# Patient Record
Sex: Female | Born: 1985 | Race: Black or African American | Hispanic: No | Marital: Single | Smoking: Never smoker
Health system: Southern US, Community
[De-identification: ages and names within clinical notes are randomized; demographics above are authoritative.]

## PROBLEM LIST (undated history)

## (undated) DIAGNOSIS — J45909 Unspecified asthma, uncomplicated: Secondary | ICD-10-CM

---

## 2015-06-07 ENCOUNTER — Ambulatory Visit
Admission: EM | Admit: 2015-06-07 | Discharge: 2015-06-07 | Disposition: A | Discharge status: Home / Self Care | Payer: Self-pay | Attending: Family Medicine | Admitting: Family Medicine

## 2015-06-07 ENCOUNTER — Encounter: Payer: Self-pay | Admitting: Emergency Medicine

## 2015-06-07 DIAGNOSIS — M26629 Arthralgia of temporomandibular joint, unspecified side: Secondary | ICD-10-CM

## 2015-06-07 HISTORY — DX: Unspecified asthma, uncomplicated: J45.909

## 2015-06-07 MED ORDER — LORATADINE 10 MG PO TABS: 10.0000 mg | ORAL_TABLET | Freq: Every day | ORAL | Status: AC

## 2015-06-07 MED ORDER — MELOXICAM 15 MG PO TABS: 15.0000 mg | ORAL_TABLET | Freq: Every day | ORAL | Status: DC | PRN

## 2015-06-07 NOTE — ED Notes (Signed)
Pt reports about 3 days of R ear pain going down neck. Nasal congestion, denies fever or chills.

## 2015-06-07 NOTE — Discharge Instructions (Signed)
Take medication as prescribed. Avoid chewy foods or repetitive jaw motions as discussed. Apply ice.   Follow up with your primary care physician as needed. Return to Urgent care as needed for new or worsening concerns.

## 2015-06-07 NOTE — ED Provider Notes (Signed)
[facility]  ____________________________________________  Time seen: Approximately 7:15 PM  I have reviewed the triage vital signs and the nursing notes.   HISTORY  Chief Complaint Otalgia   HPI [name] is a 29 y.o. female presents for complaints of right ear pain. Patient reports that she has had right ear pain intermittently 2-3 days. Patient states 3-4 days she has had some runny nose, nasal congestion and occasional cough. States that she has taken over-the-counter cough and cold treatments and states that her runny nose and congestion has improved. Patient states pain in right ear is a mild aching sensation. Denies pain radiation. Denies neck pain. Denies headache, fevers, fall or injury. Patient reports that she has been frequently moving her jaw trying to "pop her ear " because over the last several days when she had congestion she felt she had fluid in her ear. Patient states that she's also been chewing gum recently.  And patient also reports that when she is stressed she clenches down her teeth which she has noticed that she does that occasionally. Denies known grinding teeth at night. Denies hearing changes. Denies trauma.   Denies chest pain, shortness of breath, abdominal pain, fevers, nausea, vomiting, dizziness or other complaints.      Past Medical History  Diagnosis Date  . Asthma     There are no active problems to display for this patient.   History reviewed. No pertinent past surgical history.  Current Outpatient Rx  Name  Route  Sig  Dispense  Refill  .           Marland Kitchen             Allergies Review of patient's allergies indicates no known allergies.  History reviewed. No pertinent family history.  Social History Social History  Substance Use Topics  . Smoking status: Never Smoker   . Smokeless tobacco: None  . Alcohol Use: Yes   last menstrual : This past week. Patient states that she is not sexually active. Denies chance of  pregnancy. Patient states that she does not need to be tested for pregnancy ;refused pregnancy testing.  Review of Systems Constitutional: No fever/chills Eyes: No visual changes. ENT: No sore throat. Right ear discomfort. Recent runny nose and congestion. Cardiovascular: Denies chest pain. Respiratory: Denies shortness of breath. Gastrointestinal: No abdominal pain.  No nausea, no vomiting.  No diarrhea.  No constipation. Genitourinary: Negative for dysuria. Musculoskeletal: Negative for back pain. Skin: Negative for rash. Neurological: Negative for headaches, focal weakness or numbness.  10-point ROS otherwise negative.  ____________________________________________   PHYSICAL EXAM:  VITAL SIGNS: ED Triage Vitals  Enc Vitals Group     BP 06/07/15 1844 123/57 mmHg     Pulse Rate 06/07/15 1844 70     Resp 06/07/15 1844 16     Temp 06/07/15 1844 98.1 F (36.7 C)     Temp Source 06/07/15 1844 Oral     SpO2 06/07/15 1844 100 %     Weight 06/07/15 1844 327 lb (148.326 kg)     Height 06/07/15 1844  (1.702 m)     Head Cir --      Peak Flow --      Pain Score 06/07/15 1848 3     Pain Loc --      Pain Edu? --      Excl. in GC? --     Constitutional: Alert and oriented. Well appearing and in no acute distress. Eyes: Conjunctivae are  normal. PERRL. EOMI. Head: Atraumatic. No sinus tenderness to palpation. No swelling or erythema. Left TMJ nontender. Moderate right TMJ tenderness to palpation, no swelling, full range of motion present to mouth and jaw.. No trismus. Speech clear.  Ears: no erythema, normal TMs bilaterally. No swelling, exudate or drainage. Nontender to palpation. No surrounding erythema or swelling.   Nose: Minimal clear rhinorrhea.  Mouth/Throat: Mucous membranes are moist.  Oropharynx non-erythematous. No tonsillar swelling or exudate. No uvular shift or deviation. Neck: No stridor.  No cervical spine tenderness to  palpation. Hematological/Lymphatic/Immunilogical: No cervical or auricular lymphadenopathy. Cardiovascular: Normal rate, regular rhythm. Grossly normal heart sounds.  Good peripheral circulation. Respiratory: Normal respiratory effort.  No retractions. Lungs CTAB. No wheezes, rales or rhonchi. Good air movement. Gastrointestinal: Soft and nontender. Obese abdomen. Normal Bowel sounds.   Musculoskeletal: No lower or upper extremity tenderness nor edema. No cervical, thoracic or lumbar tenderness to palpation.  Neurologic:  Normal speech and language. No gross focal neurologic deficits are appreciated. No gait instability. Skin:  Skin is warm, dry and intact. No rash noted. Psychiatric: Mood and affect are normal. Speech and behavior are normal.  ____________________________________________   LABS (all labs ordered are listed, but only abnormal results are displayed)  Labs Reviewed - No data to display  INITIAL IMPRESSION / ASSESSMENT AND PLAN / ED COURSE  Pertinent labs & imaging results that were available during my care of the patient were reviewed by me and considered in my medical decision making (see chart for details).   Very well-appearing patient. No acute distress. Presents for the complaint of to 3 days of right ear discomfort described as a mild aching sensation. Patient reports prior to onset of right ear pain she has had a few days of runny nose, nasal congestion and occasional cough. Patient states that cough and congestion has almost fully resolved. Patient does reports that while congestion was present she frequently tried to pop her ear by moving jaw in a motion of opening and closing. Denies trauma. Patient with right TMJ moderate tenderness to palpation. No signs of acute infection. Will treat supportively with daily Mobic. Patient also requests prescription for Claritin as she no longer has any, prescription given. Encouraged patient to apply ice as well as avoid eating  excessively chewy foods as well as to avoid clenching teeth. Patient to follow-up with her primary care physician.  Discussed follow up with Primary care physician this week. Discussed follow up and return parameters including no resolution or any worsening concerns. Patient verbalized understanding and agreed to plan.   ____________________________________________   FINAL CLINICAL IMPRESSION(S) / ED DIAGNOSES  Final diagnoses:  TMJ pain dysfunction syndrome       Renford DillsLindsey Ericson Nafziger, NP 06/07/15 2125

## 2017-08-04 ENCOUNTER — Encounter: Payer: Self-pay | Admitting: Emergency Medicine

## 2017-08-04 ENCOUNTER — Ambulatory Visit
Admission: EM | Admit: 2017-08-04 | Discharge: 2017-08-04 | Disposition: A | Discharge status: Home / Self Care | Payer: BLUE CROSS/BLUE SHIELD | Attending: Family Medicine | Admitting: Family Medicine

## 2017-08-04 ENCOUNTER — Other Ambulatory Visit: Payer: Self-pay

## 2017-08-04 DIAGNOSIS — R109 Unspecified abdominal pain: Secondary | ICD-10-CM

## 2017-08-04 DIAGNOSIS — R300 Dysuria: Secondary | ICD-10-CM | POA: Diagnosis not present

## 2017-08-04 DIAGNOSIS — R350 Frequency of micturition: Secondary | ICD-10-CM | POA: Diagnosis not present

## 2017-08-04 DIAGNOSIS — R110 Nausea: Secondary | ICD-10-CM | POA: Diagnosis not present

## 2017-08-04 LAB — URINALYSIS, COMPLETE (UACMP) WITH MICROSCOPIC
BILIRUBIN URINE: NEGATIVE
Glucose, UA: NEGATIVE mg/dL
HGB URINE DIPSTICK: NEGATIVE
Ketones, ur: NEGATIVE mg/dL
LEUKOCYTES UA: NEGATIVE
NITRITE: NEGATIVE
PROTEIN: NEGATIVE mg/dL
SPECIFIC GRAVITY, URINE: 1.025 (ref 1.005–1.030)
pH: 6.5 (ref 5.0–8.0)

## 2017-08-04 LAB — PREGNANCY, URINE: PREG TEST UR: NEGATIVE

## 2017-08-04 MED ORDER — SULFAMETHOXAZOLE-TRIMETHOPRIM 800-160 MG PO TABS: 1.0000 | ORAL_TABLET | Freq: Two times a day (BID) | ORAL | 0 refills | Status: DC

## 2017-08-04 NOTE — ED Provider Notes (Signed)
[facility]    CSN: [id number] Arrival date & time: 08/04/17  1915     History   Chief Complaint Chief Complaint  Patient presents with  . Abdominal Pain    HPI Linda Gross is a 32 y.o. female.   The history is provided by the patient.  Abdominal Pain  Associated symptoms: dysuria and nausea   Associated symptoms: no fever, no vaginal discharge and no vomiting   Urinary Frequency  This is a new problem. Associated symptoms include abdominal pain.  Dysuria  Pain quality:  Aching Pain severity:  Mild Onset quality:  Sudden Duration:  1 day Timing:  Constant Progression:  Unchanged Chronicity:  New Recent urinary tract infections: no   Relieved by:  None tried Ineffective treatments:  None tried Urinary symptoms: frequent urination   Urinary symptoms comment:  Urgency Associated symptoms: abdominal pain and nausea   Associated symptoms: no fever, no flank pain, no genital lesions, no vaginal discharge and no vomiting   Risk factors: no hx of pyelonephritis, no hx of urolithiasis, no kidney transplant, not pregnant, no recurrent urinary tract infections, no renal cysts, no renal disease, no single kidney and no urinary catheter     Past Medical History:  Diagnosis Date  . Asthma     There are no active problems to display for this patient.   History reviewed. No pertinent surgical history.  OB History    No data available       Home Medications    Prior to Admission medications   Medication Sig Start Date End Date Taking? Authorizing Provider  albuterol (PROVENTIL HFA;VENTOLIN HFA) 108 (90 Base) MCG/ACT inhaler Inhale 2 puffs into the lungs every 6 (six) hours as needed for wheezing or shortness of breath.   Yes [provider]  fluticasone (FLOVENT HFA) 110 MCG/ACT inhaler Inhale 2 puffs into the lungs 2 (two) times daily.   Yes [provider]  loratadine (CLARITIN) 10 MG tablet Take 1 tablet (10 mg total) by mouth daily.  06/07/15   Renford DillsMiller, Lindsey, NP  meloxicam (MOBIC) 15 MG tablet Take 1 tablet (15 mg total) by mouth daily as needed for pain. 06/07/15   Renford DillsMiller, Lindsey, NP  sulfamethoxazole-trimethoprim (BACTRIM DS,SEPTRA DS) 800-160 MG tablet Take 1 tablet by mouth 2 (two) times daily. 08/04/17   Payton Mccallumonty, Kobee Medlen, MD    Family History Family History  Problem Relation Age of Onset  . Diabetes Mother   . Hypertension Mother   . Atrial fibrillation Mother   . Congestive Heart Failure Father   . Heart disease Father   . Diabetes Father   . Hypertension Father   . Alcohol abuse Father     Social History Social History   Tobacco Use  . Smoking status: Never Smoker  . Smokeless tobacco: Never Used  Substance Use Topics  . Alcohol use: Yes    Comment: socially  . Drug use: No     Allergies   Patient has no known allergies.   Review of Systems Review of Systems  Constitutional: Negative for fever.  Gastrointestinal: Positive for abdominal pain and nausea. Negative for vomiting.  Genitourinary: Positive for dysuria and frequency. Negative for flank pain and vaginal discharge.     Physical Exam Triage Vital Signs ED Triage Vitals  Enc Vitals Group     BP 08/04/17 1955 133/70     Pulse Rate 08/04/17 1955 67     Resp 08/04/17 1955 16     Temp 08/04/17  1955 98.7 F (37.1 C)     Temp Source 08/04/17 1955 Oral     SpO2 08/04/17 1955 100 %     Weight 08/04/17 1955 (!) 320 lb (145.2 kg)     Height 08/04/17 1955 5\' 6"  (1.676 m)     Head Circumference --      Peak Flow --      Pain Score 08/04/17 1956 3     Pain Loc --      Pain Edu? --      Excl. in GC? --    No data found.  Updated Vital Signs BP 133/70 (BP Location: Left Arm)   Pulse 67   Temp 98.7 F (37.1 C) (Oral)   Resp 16   Ht 5\' 6"  (1.676 m)   Wt (!) 320 lb (145.2 kg)   LMP 07/20/2017   SpO2 100%   BMI 51.65 kg/m   Visual Acuity Right Eye Distance:   Left Eye Distance:   Bilateral Distance:    Right Eye Near:     Left Eye Near:    Bilateral Near:     Physical Exam  Constitutional: She appears well-developed and well-nourished. No distress.  Abdominal: Soft. Bowel sounds are normal. She exhibits no distension and no mass. There is tenderness (suprapubic). There is no rebound and no guarding.  Skin: She is not diaphoretic.  Nursing note and vitals reviewed.    UC Treatments / Results  Labs (all labs ordered are listed, but only abnormal results are displayed) Labs Reviewed  URINALYSIS, COMPLETE (UACMP) WITH MICROSCOPIC - Abnormal; Notable for the following components:      Result Value   Squamous Epithelial / LPF 0-5 (*)    Bacteria, UA RARE (*)    All other components within normal limits  URINE CULTURE  PREGNANCY, URINE    EKG  EKG Interpretation None       Radiology No results found.  Procedures Procedures (including critical care time)  Medications Ordered in UC Medications - No data to display   Initial Impression / Assessment and Plan / UC Course  I have reviewed the triage vital signs and the nursing notes.  Pertinent labs & imaging results that were available during my care of the patient were reviewed by me and considered in my medical decision making (see chart for details).       Final Clinical Impressions(s) / UC Diagnoses   Final diagnoses:  Urinary frequency    ED Discharge Orders        Ordered    sulfamethoxazole-trimethoprim (BACTRIM DS,SEPTRA DS) 800-160 MG tablet  2 times daily     08/04/17 2038     1. Lab results and diagnosis reviewed with patient 2. rx as per orders above; reviewed possible side effects, interactions, risks and benefits  3. Recommend supportive treatment with increased water 4. Check culture 5. Follow-up prn if symptoms worsen or don't improve  Controlled Substance Prescriptions [place] Controlled Substance Registry consulted? Not Applicable   Payton Mccallum, MD 08/04/17 2121

## 2017-08-04 NOTE — ED Triage Notes (Addendum)
Patient in today c/o right lower quad abdominal pain since 10 am yesterday, but is getting worse. Patient states it feels like something is pulling. Patient denies urinary frequency or pain, but does states that she has urinary urgency. Patient denies any vaginal discharge.

## 2017-08-06 LAB — URINE CULTURE
Culture: <10000 — AB
SPECIAL REQUESTS: NORMAL

## 2019-09-06 ENCOUNTER — Other Ambulatory Visit: Payer: Self-pay

## 2019-09-06 ENCOUNTER — Ambulatory Visit
Admission: EM | Admit: 2019-09-06 | Discharge: 2019-09-06 | Disposition: A | Discharge status: Home / Self Care | Payer: BC Managed Care – PPO | Attending: Family Medicine | Admitting: Family Medicine

## 2019-09-06 ENCOUNTER — Ambulatory Visit (INDEPENDENT_AMBULATORY_CARE_PROVIDER_SITE_OTHER): Payer: BC Managed Care – PPO

## 2019-09-06 DIAGNOSIS — M25571 Pain in right ankle and joints of right foot: Secondary | ICD-10-CM

## 2019-09-06 DIAGNOSIS — W108XXA Fall (on) (from) other stairs and steps, initial encounter: Secondary | ICD-10-CM | POA: Diagnosis not present

## 2019-09-06 DIAGNOSIS — S8254XA Nondisplaced fracture of medial malleolus of right tibia, initial encounter for closed fracture: Secondary | ICD-10-CM

## 2019-09-06 HISTORY — DX: Morbid (severe) obesity due to excess calories: E66.01

## 2019-09-06 MED ORDER — IBUPROFEN 800 MG PO TABS: 800.0000 mg | ORAL_TABLET | Freq: Three times a day (TID) | ORAL | 0 refills | Status: AC

## 2019-09-06 MED ORDER — TRAMADOL HCL 50 MG PO TABS: 50.0000 mg | ORAL_TABLET | Freq: Three times a day (TID) | ORAL | 0 refills | Status: DC | PRN

## 2019-09-06 NOTE — ED Provider Notes (Signed)
[facility]    CSN: [id number] Arrival date & time: 09/06/19  1854      History   Chief Complaint Chief Complaint  Patient presents with  . Ankle Pain    HPI Linda Gross is a 34 y.o. female.   Linda Gross presents with complaints of right ankle pain after she mis-stepped on her stairs and fell this evening. Pain to medial right ankle. Hasn't taken any medications for symptoms. No numbness or tingling to toes. No previous ankle injury. Has been able to ambulate but has much pain with weight bearing, has to limp.    ROS per HPI, negative if not otherwise mentioned.      Past Medical History:  Diagnosis Date  . Asthma   . Morbid obesity (HCC)     There are no problems to display for this patient.   History reviewed. No pertinent surgical history.  OB History   No obstetric history on file.      Home Medications    Prior to Admission medications   Medication Sig Start Date End Date Taking? Authorizing Provider  albuterol (PROVENTIL HFA;VENTOLIN HFA) 108 (90 Base) MCG/ACT inhaler Inhale 2 puffs into the lungs every 6 (six) hours as needed for wheezing or shortness of breath.    [provider]  fluticasone (FLOVENT HFA) 110 MCG/ACT inhaler Inhale 2 puffs into the lungs 2 (two) times daily.    [provider]  ibuprofen (ADVIL) 800 MG tablet Take 1 tablet (800 mg total) by mouth 3 (three) times daily. 09/06/19   Georgetta Haber, NP  loratadine (CLARITIN) 10 MG tablet Take 1 tablet (10 mg total) by mouth daily. 06/07/15   Renford Dills, NP  traMADol (ULTRAM) 50 MG tablet Take 1 tablet (50 mg total) by mouth every 8 (eight) hours as needed. 09/06/19   Georgetta Haber, NP    Family History Family History  Problem Relation Age of Onset  . Diabetes Mother   . Hypertension Mother   . Atrial fibrillation Mother   . Congestive Heart Failure Father   . Heart disease Father   . Diabetes Father   . Hypertension Father   . Alcohol  abuse Father     Social History Social History   Tobacco Use  . Smoking status: Never Smoker  . Smokeless tobacco: Never Used  Substance Use Topics  . Alcohol use: Yes    Comment: socially  . Drug use: No     Allergies   Pineapple   Review of Systems Review of Systems   Physical Exam Triage Vital Signs ED Triage Vitals  Enc Vitals Group     BP 09/06/19 1919 (!) 143/74     Pulse Rate 09/06/19 1919 (!) 102     Resp 09/06/19 1919 20     Temp 09/06/19 1919 98.2 F (36.8 C)     Temp Source 09/06/19 1919 Oral     SpO2 09/06/19 1919 99 %     Weight --      Height 09/06/19 1920 5\' 6"  (1.676 m)     Head Circumference --      Peak Flow --      Pain Score 09/06/19 1916 4     Pain Loc --      Pain Edu? --      Excl. in GC? --    No data found.  Updated Vital Signs BP (!) 143/74 (BP Location: Right Arm)   Pulse (!) 102  Temp 98.2 F (36.8 C) (Oral)   Resp 20   Ht 5\' 6"  (1.676 m)   LMP 09/04/2019   SpO2 99%   BMI 51.65 kg/m   Visual Acuity Right Eye Distance:   Left Eye Distance:   Bilateral Distance:    Right Eye Near:   Left Eye Near:    Bilateral Near:     Physical Exam Constitutional:      General: She is not in acute distress.    Appearance: She is well-developed.  Cardiovascular:     Rate and Rhythm: Normal rate.  Pulmonary:     Effort: Pulmonary effort is normal.  Musculoskeletal:     Right ankle: No ecchymosis or lacerations. Tenderness present over the medial malleolus. Decreased range of motion. Normal pulse.     Comments: Right medial malleolus with tenderness; pain with extension and flexion of ankle with some limitation noted to ROM; strong pedal pulse; cap refill < 2 seconds  And gross sensation intact   Skin:    General: Skin is warm and dry.  Neurological:     Mental Status: She is alert and oriented to person, place, and time.      UC Treatments / Results  Labs (all labs ordered are listed, but only abnormal results are  displayed) Labs Reviewed - No data to display  EKG   Radiology DG Ankle Complete Right  Result Date: 09/06/2019 CLINICAL DATA:  Right ankle injury due to a fall on stairs today. Initial encounter. EXAM: RIGHT ANKLE - COMPLETE 3+ VIEW COMPARISON:  None. FINDINGS: The patient has a nondisplaced fracture of the medial malleolus. No other acute bony abnormality is seen. Calcaneal spurring noted. There is some soft tissue swelling about the ankle. IMPRESSION: Acute nondisplaced medial malleolar fracture with associated soft tissue swelling. Electronically Signed   By: Inge Rise M.D.   On: 09/06/2019 19:37    Procedures Procedures (including critical care time)  Medications Ordered in UC Medications - No data to display  Initial Impression / Assessment and Plan / UC Course  I have reviewed the triage vital signs and the nursing notes.  Pertinent labs & imaging results that were available during my care of the patient were reviewed by me and considered in my medical decision making (see chart for details).     Right non displaced medial malleolar fracture s/p fall tonight. Pain management discussed. Ace and cam walker applied, NWB and crutches provided. Follow up with orthopedics for definitive treatment. Patient verbalized understanding and agreeable to plan.   Final Clinical Impressions(s) / UC Diagnoses   Final diagnoses:  Nondisplaced fracture of medial malleolus of right tibia, initial encounter for closed fracture     Discharge Instructions     Do not bear weight.  Elevation, ice as able, to help with pain.  Ibuprofen every 8 hours, take with food.  Tramadol for breakthrough pain. May cause drowsiness. Please do not take if driving or drinking alcohol.  May cause constipation.  Leave boot on at all times.  Please call tomorrow to follow up with orthopedics for definitive treatment.    ED Prescriptions    Medication Sig Dispense Auth. Provider   ibuprofen (ADVIL)  800 MG tablet Take 1 tablet (800 mg total) by mouth 3 (three) times daily. 30 tablet Augusto Gamble B, NP   traMADol (ULTRAM) 50 MG tablet Take 1 tablet (50 mg total) by mouth every 8 (eight) hours as needed. 15 tablet Zigmund Gottron, NP  I have reviewed the PDMP during this encounter.   Georgetta Haber, NP 09/06/19 2001

## 2019-09-06 NOTE — Discharge Instructions (Signed)
Do not bear weight.  Elevation, ice as able, to help with pain.  Ibuprofen every 8 hours, take with food.  Tramadol for breakthrough pain. May cause drowsiness. Please do not take if driving or drinking alcohol.  May cause constipation.  Leave boot on at all times.  Please call tomorrow to follow up with orthopedics for definitive treatment.

## 2019-09-06 NOTE — ED Triage Notes (Signed)
Pt fell down last 2 indoor steps this evening. Pain to medial right ankle. Limping gait in triage

## 2020-10-28 IMAGING — CR DG ANKLE COMPLETE 3+V*R*
3 series · 3 of 3 positions shown · non-contrast
Comparison: None.

CLINICAL DATA: Right ankle injury due to a fall on stairs today.
Initial encounter.

EXAM:
RIGHT ANKLE - COMPLETE 3+ VIEW

[ankle ap]
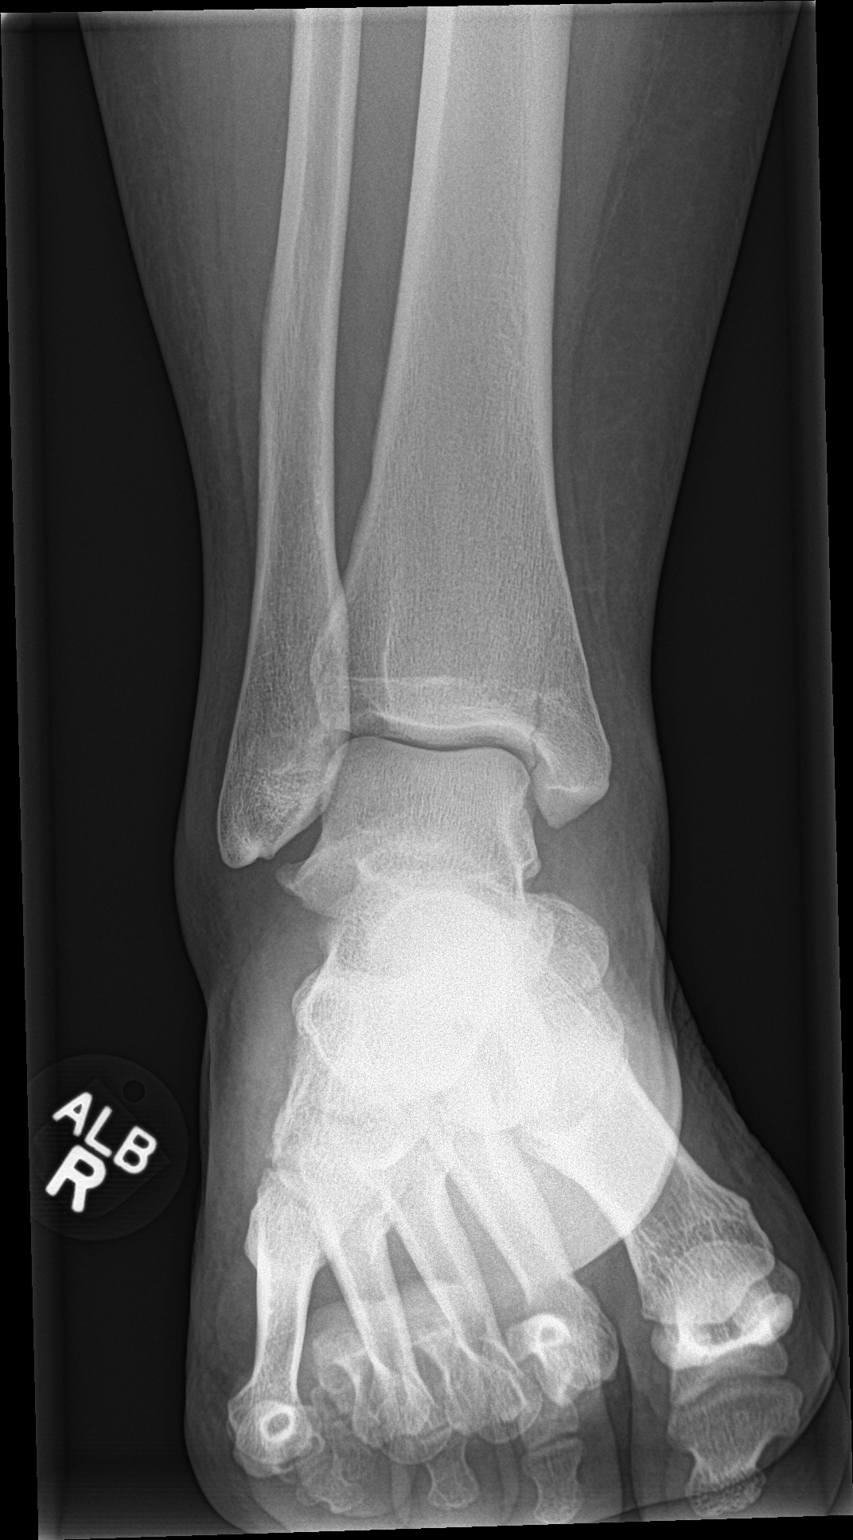

[ankle obl]
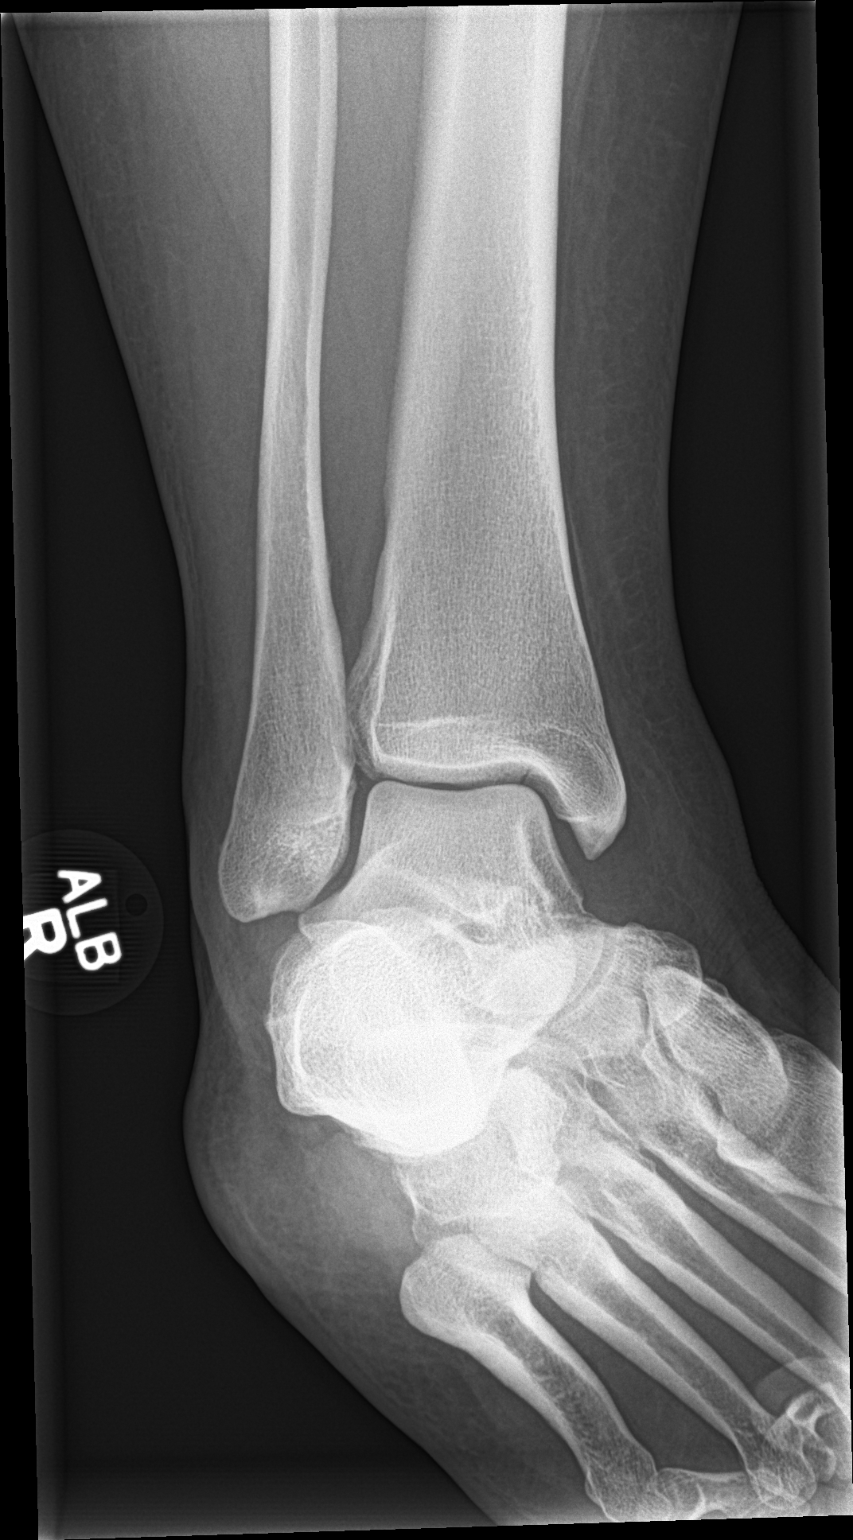

[ankle lat]
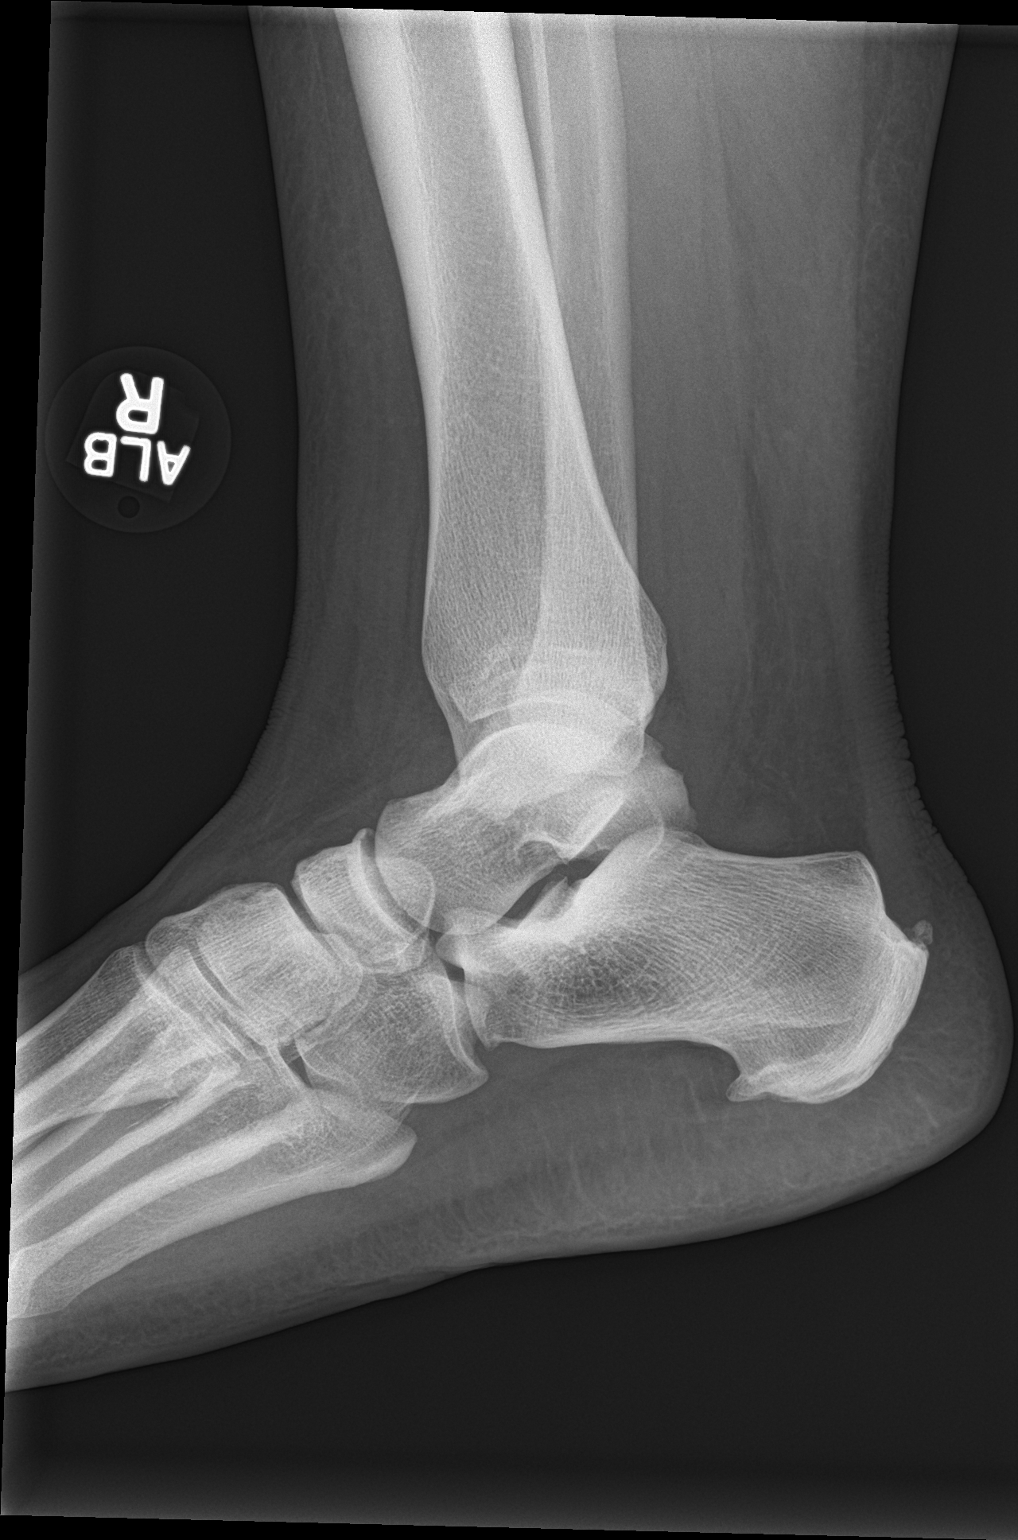

[3 of 3 positions shown; findings below may reference images not displayed]

FINDINGS: The patient has a nondisplaced fracture of the medial malleolus. No
other acute bony abnormality is seen. Calcaneal spurring noted.
There is some soft tissue swelling about the ankle.
IMPRESSION: Acute nondisplaced medial malleolar fracture with associated soft
tissue swelling.

## 2021-02-02 ENCOUNTER — Other Ambulatory Visit: Payer: Self-pay

## 2021-02-02 ENCOUNTER — Ambulatory Visit
Admission: EM | Admit: 2021-02-02 | Discharge: 2021-02-02 | Disposition: A | Discharge status: Home / Self Care | Payer: BC Managed Care – PPO | Attending: Emergency Medicine | Admitting: Emergency Medicine

## 2021-02-02 DIAGNOSIS — L03319 Cellulitis of trunk, unspecified: Secondary | ICD-10-CM

## 2021-02-02 MED ORDER — SULFAMETHOXAZOLE-TRIMETHOPRIM 800-160 MG PO TABS: 1.0000 | ORAL_TABLET | Freq: Two times a day (BID) | ORAL | 0 refills | Status: AC

## 2021-02-02 MED ORDER — SULFAMETHOXAZOLE-TRIMETHOPRIM 800-160 MG PO TABS: 1.0000 | ORAL_TABLET | Freq: Two times a day (BID) | ORAL | 0 refills | Status: DC

## 2021-02-02 NOTE — ED Triage Notes (Signed)
Pt c/o multiple bumps on her chest and under her breast for several days. Pt states they are painful, denies itching. Pt denies any history of previous similar episodes.

## 2021-02-02 NOTE — ED Provider Notes (Signed)
[facility]    CSN: [id number] Arrival date & time: 02/02/21  1736      History   Chief Complaint Chief Complaint  Patient presents with   Skin Ulcer    HPI Linda Gross is a 35 y.o. female.   HPI  35 year old female here for evaluation of multiple bumps.  Patient reports that she has been experiencing painful bumps on the undersides of both breasts in the front part of her abdomen for the past several days.  She states that these do not itch and she has not had a fever.  She not been exposed to anybody with skin lesions other than her nephew who was diagnosed with impetigo over a week ago.  She does not belong to the gym.  She states that some of the lesions are draining a small amount of pus.  She never had a therapist in the past.  Past Medical History:  Diagnosis Date   Asthma    Morbid obesity (HCC)     There are no problems to display for this patient.   History reviewed. No pertinent surgical history.  OB History   No obstetric history on file.      Home Medications    Prior to Admission medications   Medication Sig Start Date End Date Taking? Authorizing Provider  albuterol (PROVENTIL HFA;VENTOLIN HFA) 108 (90 Base) MCG/ACT inhaler Inhale 2 puffs into the lungs every 6 (six) hours as needed for wheezing or shortness of breath.   Yes [provider]  fluticasone (FLOVENT HFA) 110 MCG/ACT inhaler Inhale 2 puffs into the lungs 2 (two) times daily.   Yes [provider]  loratadine (CLARITIN) 10 MG tablet Take 1 tablet (10 mg total) by mouth daily. 06/07/15  Yes Renford Dills, NP  ibuprofen (ADVIL) 800 MG tablet Take 1 tablet (800 mg total) by mouth 3 (three) times daily. 09/06/19   Georgetta Haber, NP  sulfamethoxazole-trimethoprim (BACTRIM DS) 800-160 MG tablet Take 1 tablet by mouth 2 (two) times daily for 10 days. 02/02/21 02/12/21  Becky Augusta, NP    Family History Family History  Problem Relation Age of Onset    Diabetes Mother    Hypertension Mother    Atrial fibrillation Mother    Congestive Heart Failure Father    Heart disease Father    Diabetes Father    Hypertension Father    Alcohol abuse Father     Social History Social History   Tobacco Use   Smoking status: Never   Smokeless tobacco: Never  Vaping Use   Vaping Use: Never used  Substance Use Topics   Alcohol use: Yes    Comment: socially   Drug use: No     Allergies   Pineapple   Review of Systems Review of Systems  Constitutional:  Negative for activity change, appetite change and fever.  Skin:  Positive for color change and wound.    Physical Exam Triage Vital Signs ED Triage Vitals  Enc Vitals Group     BP 02/02/21 1808 120/67     Pulse Rate 02/02/21 1808 78     Resp 02/02/21 1808 18     Temp 02/02/21 1808 98.7 F (37.1 C)     Temp Source 02/02/21 1808 Oral     SpO2 02/02/21 1808 100 %     Weight 02/02/21 1803 (!) 350 lb (158.8 kg)     Height 02/02/21 1803 5\' 7"  (1.702 m)     Head Circumference --  Peak Flow --      Pain Score 02/02/21 1803 0     Pain Loc --      Pain Edu? --      Excl. in GC? --    No data found.  Updated Vital Signs BP 120/67 (BP Location: Left Arm)   Pulse 78   Temp 98.7 F (37.1 C) (Oral)   Resp 18   Ht 5\' 7"  (1.702 m)   Wt (!) 350 lb (158.8 kg)   LMP 02/01/2021   SpO2 100%   BMI 54.82 kg/m   Visual Acuity Right Eye Distance:   Left Eye Distance:   Bilateral Distance:    Right Eye Near:   Left Eye Near:    Bilateral Near:     Physical Exam Vitals and nursing note reviewed.  Constitutional:      General: She is not in acute distress.    Appearance: Normal appearance. She is obese. She is not ill-appearing.  Skin:    General: Skin is warm.     Capillary Refill: Capillary refill takes less than 2 seconds.     Findings: Erythema and lesion present.  Neurological:     General: No focal deficit present.     Mental Status: She is alert and oriented to  person, place, and time.  Psychiatric:        Mood and Affect: Mood normal.        Behavior: Behavior normal.        Thought Content: Thought content normal.        Judgment: Judgment normal.     UC Treatments / Results  Labs (all labs ordered are listed, but only abnormal results are displayed) Labs Reviewed - No data to display  EKG   Radiology No results found.  Procedures Procedures (including critical care time)  Medications Ordered in UC Medications - No data to display  Initial Impression / Assessment and Plan / UC Course  I have reviewed the triage vital signs and the nursing notes.  Pertinent labs & imaging results that were available during my care of the patient were reviewed by me and considered in my medical decision making (see chart for details).  Patient is a very pleasant and nontoxic-appearing 35 year old female here for evaluation of bumps on the underside of both breasts and on her abdomen that been present for several days.  She describes them as painful but states they do not itch.  The lesion in her upper central abdomen and on her right upper quadrant of her abdomen are both draining a yellow pus.  There are 2 lesions on the underside of both breasts that are not draining but there are raised erythematous lesions.  There is no induration or fluctuance indicating the presence of an abscess.  She has another lesion in the border between the left upper quadrant and left lower quadrant that she noticed today that is slightly larger and erythematous and raised.  Patient's exam is consistent with cellulitis and I am suspicious that due to the pain these lesions are causing that they may be MRSA lesions.  We will treat patient with Bactrim twice daily for 10 days to help resolve the cellulitis.  Return precautions reviewed with patient.   Final Clinical Impressions(s) / UC Diagnoses   Final diagnoses:  Cellulitis of trunk, unspecified site of trunk      Discharge Instructions      Take the Bactrim twice daily with food and a full glass of  water for 10 days.  Bactrim will make you more sensitive to sunburn so wear sunscreen when outdoors and reapply it every 90 minutes.  Apply warm compresses to help promote drainage.  Use OTC Tylenol and Ibuprofen according to the package instructions as needed for pain.  Return for new or worsening symptoms.       ED Prescriptions     Medication Sig Dispense Auth. Provider   sulfamethoxazole-trimethoprim (BACTRIM DS) 800-160 MG tablet  (Status: Discontinued) Take 1 tablet by mouth 2 (two) times daily for 10 days. 20 tablet Becky Augusta, NP   sulfamethoxazole-trimethoprim (BACTRIM DS) 800-160 MG tablet Take 1 tablet by mouth 2 (two) times daily for 10 days. 20 tablet Becky Augusta, NP      PDMP not reviewed this encounter.   Becky Augusta, NP 02/02/21 1836

## 2021-02-02 NOTE — Discharge Instructions (Addendum)
Take the Bactrim twice daily with food and a full glass of water for 10 days.  Bactrim will make you more sensitive to sunburn so wear sunscreen when outdoors and reapply it every 90 minutes.  Apply warm compresses to help promote drainage.  Use OTC Tylenol and Ibuprofen according to the package instructions as needed for pain.  Return for new or worsening symptoms.
